# Patient Record
Sex: Male | Born: 2000 | Race: White | Hispanic: No | Marital: Single | Smoking: Never smoker
Health system: Southern US, Community
[De-identification: ages and names within clinical notes are randomized; demographics above are authoritative.]

---
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
## 2019-08-14 ENCOUNTER — Other Ambulatory Visit: Payer: Self-pay

## 2019-08-14 ENCOUNTER — Ambulatory Visit (INDEPENDENT_AMBULATORY_CARE_PROVIDER_SITE_OTHER): Payer: Medicaid Other

## 2019-08-14 ENCOUNTER — Ambulatory Visit (INDEPENDENT_AMBULATORY_CARE_PROVIDER_SITE_OTHER): Payer: Medicaid Other | Admitting: Family Medicine

## 2019-08-14 ENCOUNTER — Encounter: Payer: Self-pay | Admitting: Family Medicine

## 2019-08-14 VITALS — BP 106/60 | HR 79 | Ht 66.0 in | Wt 119.6 lb

## 2019-08-14 DIAGNOSIS — M25562 Pain in left knee: Secondary | ICD-10-CM

## 2019-08-14 DIAGNOSIS — M25462 Effusion, left knee: Secondary | ICD-10-CM

## 2019-08-14 NOTE — Patient Instructions (Signed)
Thank you for coming in today. Plan for MRI of knee.  Use the hinged knee brace.  Use crutches as needed.  Ok to walk without crutches if able.  Use tylenol or ibuprofen for pain and swelling.  Apply ice for 20 mins 2x daily for a few days.  Return following MRI.  Let me know if you do not hear about MRI scheduling soon.  Phone number to radiology at [facility] is [phone] You can get crutches from [facility] supply here in [place].    Anterior Cruciate Ligament Tear  Ligaments are strong bands of tissue that connect bones to each other. The anterior cruciate ligament (ACL) connects your shin bone to your thigh bone. A tear in this ligament can cause pain and make it hard for you to use your leg to support your body weight. There are three types of ACL injuries:  Grade 1. In this type, the ligament is stretched.  Grade 2. In this type, the ligament is partially torn.  Grade 3. In this type, the ligament is completely torn. What are the causes? This condition happens when too much pressure is put on the ACL. This condition may be caused by:  Twisting your knee, especially with your foot planted.  Making a quick change in direction (cut and pivot).  Slowing down quickly while running.  Landing a jump without bending your knee.  Forcefully straightening (hyperextending)your knee more than normal.  Getting hit in the knee.  Hitting your knee on the ground. What increases the risk? You are more likely to develop this condition if:  You are a woman.  You play sports that involve jumping or changing directions often. These include: ? Football. ? Basketball. ? Soccer. ? Volleyball. ? Skiing. ? Hockey. ? Gymnastics. What are the signs or symptoms? Symptoms of this condition include:  Feeling or hearing a popping at the time of injury.  A feeling that your knee is giving way at the time of injury.  Pain.  Swelling.  Tenderness.  Instability  when you try to put weight on your injured leg.  Inability to move your knee as far as normal.  Difficulty walking. How is this diagnosed? This condition may be diagnosed based on:  Your symptoms.  Your medical history.  A physical exam. During your physical exam, your provider will test your knee to see if it moves more than it should (has laxity).  Imaging tests, such as: ? An X-ray. This may be done to check for bone injuries. ? MRI. This may be done to see if the tear is partial or complete and to check for additional injuries. How is this treated? This condition may be treated by:  Resting your knee.  Avoiding activities that cause pain, instability, or swelling.  Raising (elevating) your knee while resting.  Keeping weight off your leg until pain and swelling improve. You may need to use crutches or a walker.  Icing your knee.  Taking medicine to reduce pain and swelling.  Wearing a knee brace.  Wearing a compression bandage or wrap to reduce swelling.  Doing range-of-motion, strengthening, and stretching exercises (physical therapy).  Having surgery. This may be needed if you are very active and have a complete tear. Follow these instructions at home: If you have a brace:  Wear the brace as told by your health care provider. Remove it only as told by your health care provider.  Loosen the brace if your toes tingle, become numb, or turn  cold and blue.  Keep the brace clean.  If the brace is not waterproof: ? Do not let it get wet. ? Cover it with a watertight covering when you take a bath or shower. Managing pain, stiffness, and swelling   If directed, put ice on your knee: ? If you have a removable brace, remove it as told by your health care provider. ? Put ice in a plastic bag. ? Place a towel between your skin and the bag. ? Leave the ice on for 20 minutes, 2-3 times a day.  Move your foot often to reduce stiffness and swelling.  Raise (elevate)  your knee above the level of your heart while you are sitting or lying down. Medicines  Take over-the-counter and prescription medicines only as told by your health care provider.  Ask your health care provider if the medicine prescribed to you: ? Requires you to avoid driving or using heavy machinery. ? Can cause constipation. You may need to take actions to prevent or treat constipation, such as:  Drink enough fluid to keep your urine pale yellow.  Take over-the-counter or prescription medicines.  Eat foods that are high in fiber, such as beans, whole grains, and fresh fruits and vegetables.  Limit foods that are high in fat and processed sugars, such as fried or sweet foods. Activity  Ask your health care provider when it is safe to drive if you have a brace on your leg.  Return to your normal activities as told by your health care provider. Ask your health care provider what activities are safe for you.  Do exercises as told by your health care provider. General instructions  Do not use the injured limb to support your body weight until your health care provider says that you can. Use crutches or a walker as told by your health care provider.  Do not use any products that contain nicotine or tobacco, such as cigarettes, e-cigarettes, and chewing tobacco. These can delay healing. If you need help quitting, ask your health care provider.  Keep all follow-up visits as told by your health care provider. This is important. How is this prevented?  Warm up and stretch before being active.  Cool down and stretch after being active.  Give your body time to rest between periods of activity.  Make sure to use equipment that fits you.  If you wear cleats, make sure they are appropriate for your playing surface.  Be safe and responsible while being active. This will help you avoid falls.  Maintain physical fitness, including: ? Strength. ? Flexibility. ? Cardiovascular  fitness. ? Endurance. Contact a health care provider if:  Your symptoms are not improving.  Your symptoms are getting worse. Summary  A tear in the anterior cruciate ligament (ACL) can cause pain and make it hard for you to use your leg to support your body weight.  Treatment for this condition may include resting, icing, wearing compression wraps or a knee brace, taking medicines, or having surgery.  Do not use the injured limb to support your body weight until your health care provider says that you can. Use crutches or a walker as told by your health care provider.  Contact a health care provider if your symptoms do not improve or get worse.  Keep all follow-up visits as told by your health care provider. This is important. This information is not intended to replace advice given to you by your health care provider. Make sure you discuss any  questions you have with your health care provider. Document Revised: 02/14/2018 Document Reviewed: 02/14/2018 Elsevier Patient Education  2020 [employer].

## 2019-08-14 NOTE — Progress Notes (Signed)
Subjective:    CC: L Knee pain  I, Molly Weber, LAT, ATC, am serving as scribe for Dr. Lynne Leader.  HPI: Pt is a 19 y/o male presenting w/ c/o L lateral knee pain x one day when he got hit in his knee by a teammate and then took a quick step for a ball that caused pain in his L post-lateral knee.  He describes a sensation of instability after cutting attempt.  Pt rates his pain at a 6/10 and describes his pain as aching.  He notes the knee developed swelling and effusion rapidly following the injury.  He was unable to continue playing.  Radiating pain: no Knee swelling: yes Mechanical knee symptoms: No Aggravating factors: Walking; knee flexion Treatments tried: IBU, IcyHot,   Pertinent review of Systems: No fevers or chills.  No radiating pain weakness or numbness distally.  Relevant historical information: No history of prior knee injury.   Objective:    Vitals:   08/14/19 1358  BP: 106/60  Pulse: 79  SpO2: 95%   General: Well Developed, well nourished, and in no acute distress.   MSK:  Left knee: Large effusion otherwise normal-appearing. Range of motion 5-100 degrees. Nontender to palpation. No laxity to valgus varus stress test without pain. Slight laxity to anterior drawer testing however exam limited by guarding. Mildly positive medial and lateral McMurray's test however again exam is limited by guarding. Intact flexion extension strength.  Right knee: Normal-appearing Nontender. Range of motion 0-120 degrees. No laxity to valgus varus stress test or anterior posterior drawer test. Negative Murray's test. Intact strength.  Antalgic gait.  Lab and Radiology Results  X-ray images left knee obtained today personally and independently reviewed Moderate effusion.  No visible fractures.  No severe degenerative changes. Await formal radiology review   Impression and Recommendations:    Assessment and Plan: 19 y.o. male with acute left knee injury.   Patient describes a noncontact injury rapidly followed by significant knee effusion and discomfort.  Patient does have some laxity and mechanical symptoms on today's exam.  Concern for ACL tear.  Plan for MRI and recheck following MRI.  Proceed with hinged knee brace and crutches..   Orders Placed This Encounter  Procedures  . DG Knee 4 Views W/Patella Left    Standing Status:   Future    Number of Occurrences:   1    Standing Expiration Date:   10/11/2020    Order Specific Question:   Reason for Exam (SYMPTOM  OR DIAGNOSIS REQUIRED)    Answer:   eval knee pain and swelling. Non-contact. Concern ACL    Order Specific Question:   Preferred imaging location?    Answer:   [facility]    Order Specific Question:   Radiology Contrast Protocol - do NOT remove file path    Answer:   \\[email or web address]  . MR Knee Left  Wo Contrast    Standing Status:   Future    Standing Expiration Date:   10/11/2020    Order Specific Question:   ** REASON FOR EXAM (FREE TEXT)    Answer:   eval poss ACL tear knee.    Order Specific Question:   What is the patient's sedation requirement?    Answer:   No Sedation    Order Specific Question:   Does the patient have a pacemaker or implanted devices?    Answer:   No    Order Specific Question:   Preferred imaging location?  Answer:   [facility] (table limit-350lbs)    Order Specific Question:   Radiology Contrast Protocol - do NOT remove file path    Answer:   \\[email or web address]   No orders of the defined types were placed in this encounter.   Discussed warning signs or symptoms. Please see discharge instructions. Patient expresses understanding.   The above documentation has been reviewed and is accurate and complete Andre Schmitt

## 2019-08-15 NOTE — Progress Notes (Signed)
No fracture visible. MRI will be helpful.

## 2019-08-21 ENCOUNTER — Encounter: Payer: Self-pay | Admitting: Family Medicine

## 2019-08-21 ENCOUNTER — Encounter: Payer: Self-pay | Admitting: Physical Therapy

## 2019-08-21 NOTE — Addendum Note (Signed)
Addended by: Edwena Felty T on: 08/21/2019 03:53 PM   Modules accepted: Orders

## 2019-08-21 NOTE — Telephone Encounter (Signed)
Patient will come back tomorrow between 1030 and 1100

## 2019-08-30 ENCOUNTER — Ambulatory Visit (HOSPITAL_BASED_OUTPATIENT_CLINIC_OR_DEPARTMENT_OTHER)
Admission: RE | Admit: 2019-08-30 | Discharge: 2019-08-30 | Disposition: A | Discharge status: Home / Self Care | Payer: Medicaid Other | Source: Ambulatory Visit | Attending: Family Medicine | Admitting: Family Medicine

## 2019-08-30 ENCOUNTER — Other Ambulatory Visit: Payer: Self-pay

## 2019-08-30 DIAGNOSIS — M25562 Pain in left knee: Secondary | ICD-10-CM | POA: Diagnosis present

## 2019-08-30 DIAGNOSIS — M25462 Effusion, left knee: Secondary | ICD-10-CM | POA: Diagnosis present

## 2019-08-31 ENCOUNTER — Encounter: Payer: Self-pay | Admitting: Family Medicine

## 2019-08-31 DIAGNOSIS — M25462 Effusion, left knee: Secondary | ICD-10-CM

## 2019-08-31 DIAGNOSIS — M25562 Pain in left knee: Secondary | ICD-10-CM

## 2019-09-01 ENCOUNTER — Encounter: Payer: Self-pay | Admitting: Physical Therapy

## 2019-09-01 NOTE — Progress Notes (Signed)
MRI knee shows a large bone contusion (bruise to bone) but fortunately the ACL did not tear.  This is great news.  Please return to clinic to discuss MRI findings in further detail.

## 2019-09-02 ENCOUNTER — Other Ambulatory Visit: Payer: Self-pay

## 2019-09-02 ENCOUNTER — Encounter: Payer: Self-pay | Admitting: Family Medicine

## 2019-09-02 ENCOUNTER — Ambulatory Visit (INDEPENDENT_AMBULATORY_CARE_PROVIDER_SITE_OTHER): Payer: Medicaid Other | Admitting: Family Medicine

## 2019-09-02 VITALS — BP 122/70 | HR 113 | Ht 66.0 in | Wt 118.4 lb

## 2019-09-02 DIAGNOSIS — M25462 Effusion, left knee: Secondary | ICD-10-CM

## 2019-09-02 NOTE — Patient Instructions (Signed)
Thank you for coming in today. Plan for PT.   [phone] Ok to use compression.  Recheck in 1 month.

## 2019-09-02 NOTE — Progress Notes (Signed)
I, Andre Schmitt, LAT, ATC, am serving as scribe for Dr. Clementeen Graham.  Andre Schmitt is a 19 y.o. male who presents to [facility] at [facility] [address] today for f/u of L knee pain and swelling after injuring his L knee while playing soccer on 08/13/19.  He was last seen by Dr. Denyse Amass on 08/14/19 and was placed in a hinged knee brace.  He had a L knee MRI on 08/30/19 and is here today to review his L knee MRI results as well as discuss next steps regarding treatment.  He has been provided w/ a HEP focusing on knee ROM and quad activation/strengthening.  Since his last visit, pt reports that his L knee is feeling better but still has a good amount of swelling.  He rates his knee pain at a 3/10.  He states that he has been walking a good amount.  He is no longer using his crutches and still using the compression wrap and his knee brace.  He has been doing his HEP daily.  Diagnostic imaging: L knee XR- 08/14/19; L knee MRI- 08/30/19   Pertinent review of systems: No fevers or chills  Relevant historical information: No significant past medical history.   Exam:  BP 122/70 (BP Location: Left Arm, Patient Position: Sitting, Cuff Size: Normal)   Pulse (!) 113   Ht 5\' 6"  (1.676 m)   Wt 118 lb 6.4 oz (53.7 kg)   SpO2 97%   BMI 19.11 kg/m  General: Well Developed, well nourished, and in no acute distress.   MSK: Left knee: Mild effusion otherwise normal-appearing nontender normal motion.    Lab and Radiology Results No results found for this or any previous visit (from the past 72 hour(s)). MR Knee Left  Wo Contrast  Result Date: 08/31/2019 CLINICAL DATA:  Left knee pain for 2 weeks since a twisting injury playing soccer. Subsequent encounter. EXAM: MRI OF THE LEFT KNEE WITHOUT CONTRAST TECHNIQUE: Multiplanar, multisequence MR imaging of the knee was performed. No intravenous contrast was administered. COMPARISON:  Plain films left knee 08/14/2019. FINDINGS: MENISCI Medial meniscus:  Intact.  Lateral meniscus:  Intact. LIGAMENTS Cruciates:  Intact. Collaterals:  Intact. CARTILAGE Patellofemoral:  Normal. Medial:  Normal. Lateral:  Normal. Joint:  Moderate to large joint effusion. Popliteal Fossa:  No Baker's cyst. Extensor Mechanism:  Intact. Bones: There is marrow edema in the medial femoral condyle consistent with a bone contusion. No fracture. Other: None. IMPRESSION: Bone contusion without fracture in the medial femoral condyle. Associated moderate to large joint effusion is noted. Negative for meniscal or ligament tear. Electronically Signed   By: 10/12/2019 M.D.   On: 08/31/2019 10:01    I, 09/02/2019, personally (independently) visualized and performed the interpretation of the images attached in this note.   Assessment and Plan: 19 y.o. male with knee pain.  Patient had noncontact injury concerning for ACL tear.  Fortunately MRI did not show ACL tear but did show bone contusion.  He likely had the same mechanism that typically would result in ACL tear but fortunately did not suffer a tear.  Plan for trial of physical therapy to improve quad strengthening.  Advance activity as tolerated and recheck in 1 month.  Total encounter time 20 minutes including charting time date of service.     Discussed warning signs or symptoms. Please see discharge instructions. Patient expresses understanding.   The above documentation has been reviewed and is accurate and complete 12

## 2019-09-05 ENCOUNTER — Encounter: Payer: Self-pay | Admitting: Family Medicine

## 2019-09-16 ENCOUNTER — Ambulatory Visit: Payer: Medicaid Other | Attending: Family Medicine

## 2019-09-16 ENCOUNTER — Other Ambulatory Visit: Payer: Self-pay

## 2019-09-16 DIAGNOSIS — R262 Difficulty in walking, not elsewhere classified: Secondary | ICD-10-CM

## 2019-09-16 DIAGNOSIS — M25562 Pain in left knee: Secondary | ICD-10-CM

## 2019-09-16 DIAGNOSIS — M25462 Effusion, left knee: Secondary | ICD-10-CM | POA: Diagnosis present

## 2019-09-16 DIAGNOSIS — M6281 Muscle weakness (generalized): Secondary | ICD-10-CM

## 2019-09-17 NOTE — Therapy (Signed)
[facility] [address] [place], [place], [address] Phone: [phone]   Fax:  [phone]  Physical Therapy Evaluation  Patient Details  Name: Andre Schmitt MRN: [id number] Date of Birth: 24-Jan-2001 Referring Provider (PT): Rodolph Bong, MD   Encounter Date: 09/16/2019  PT End of Session - 09/17/19 2206    Visit Number  1    Number of Visits  13    Date for PT Re-Evaluation  10/01/19    Authorization Type  MCD    Authorization Time Period  09/16/19-11/14/19    Authorization - Visit Number  0    Authorization - Number of Visits  3    Progress Note Due on Visit  0.01    PT Start Time  1540    PT Stop Time  1625    PT Time Calculation (min)  45 min    Activity Tolerance  Patient tolerated treatment well    Behavior During Therapy  [facility] for tasks assessed/performed       History reviewed. No pertinent past medical history.  History reviewed. No pertinent surgical history.  There were no vitals filed for this visit.   Subjective Assessment - 09/17/19 2151    Subjective  Pt reports although his L knee is improving with less pain and swelling, his is still having difficulty with standing and walking.    Limitations  Standing;Walking    How long can you sit comfortably?  not an issue    How long can you stand comfortably?  30 mins    How long can you walk comfortably?  1 hour    Diagnostic tests  MRI 08/31/19 IMPRESSION: Bone contusion without fracture in the medial femoral condyle. Associated moderate to large joint effusion is noted. Negative for meniscal or ligament tear.    Patient Stated Goals  To start working and return back to physical activity    Currently in Pain?  Yes    Pain Score  3     Pain Location  Knee    Pain Orientation  Left    Pain Descriptors / Indicators  Aching    Pain Type  Acute pain    Pain Radiating Towards  NA    Pain Onset  1 to 4 weeks ago    Pain Frequency  Intermittent    Aggravating Factors    Extended standing and walking activities    Pain Relieving Factors  Rest, cold packs    Effect of Pain on Daily Activities  Limited tolerance         [facility] PT Assessment - 09/17/19 0001      Assessment   Medical Diagnosis  Acute L knee pain    Referring Provider (PT)  Rodolph Bong, MD    Onset Date/Surgical Date  08/12/19    Hand Dominance  Right    Next MD Visit  3 weeks      Precautions   Precautions  None      Restrictions   Weight Bearing Restrictions  No      Balance Screen   Has the patient fallen in the past 6 months  No    Has the patient had a decrease in activity level because of a fear of falling?   Yes    Is the patient reluctant to leave their home because of a fear of falling?   No      Home Public house manager residence  Living Arrangements  Parent    Type of Home  House    Home Access  Stairs to enter      Prior Function   Level of Independence  Independent      Cognition   Overall Cognitive Status  Within Functional Limits for tasks assessed      Observation/Other Assessments-Edema    Edema  Circumferential      Circumferential Edema   Circumferential - Left   L 32.4 cm; R 31.6      Sensation   Light Touch  Appears Intact      Coordination   Gross Motor Movements are Fluid and Coordinated  Yes      Functional Tests   Functional tests  --      Posture/Postural Control   Posture/Postural Control  No significant limitations      ROM / Strength   AROM / PROM / Strength  AROM;Strength      AROM   AROM Assessment Site  Knee    Right/Left Knee  Right;Left    Right Knee Extension  0    Right Knee Flexion  140    Left Knee Extension  0    Left Knee Flexion  95      Strength   Strength Assessment Site  Knee    Right/Left Knee  Right;Left    Right Knee Flexion  5/5    Right Knee Extension  5/5    Left Knee Flexion  4+/5    Left Knee Extension  4+/5      Palpation   Patella mobility  WNLs      Special Tests     Special Tests  Meniscus Tests;Knee Special Tests    Knee Special tests   other    Meniscus Tests  McMurray Test   Neg     other    Findings  Negative    Comments  Ant drawer; Collateral ligament tests      McMurray Test   Findings  Negative      Ambulation/Gait   Ambulation/Gait  Yes    Ambulation Distance (Feet)  510 Feet   2 min walking test   Gait Pattern  Antalgic    Gait velocity  4.25 ft/sec      Functional Gait  Assessment   Gait assessed   Yes                Objective measurements completed on examination: See above findings.              PT Education - 09/17/19 2204    Education Details  Pt was educated in gradually increasing his activity level as tolerated and using a cold pack as often as every 2 hours to manage L knee swelling; HEP    Person(s) Educated  Patient    Methods  Explanation;Demonstration;Tactile cues;Verbal cues;Handout    Comprehension  Verbalized understanding;Returned demonstration;Verbal cues required;Tactile cues required;Need further instruction       PT Short Term Goals - 09/17/19 2301      PT SHORT TERM GOAL #1   Title  Pt will be Ind in an initial PT HEP to address L LE ROM, strength, and functional mobility    Baseline  HEP provided by MD    Time  3    Period  Weeks    Status  New    Target Date  10/08/19        PT Long Term Goals - 09/17/19 2309  PT LONG TERM GOAL #1   Title  Pt will be ind in a final HEP to continue the rehab process at home following DC from [place].    Baseline  Initial HEP    Time  6    Period  Weeks    Status  New    Target Date  10/29/19      PT LONG TERM GOAL #2   Title  Pt's L knee swelling will decrease to 31.8 cm to improve pt's L knee flexion ROM and functional mobility.    Baseline  32.4 cm    Time  6    Period  Weeks    Status  New    Target Date  10/29/19      PT LONG TERM GOAL #3   Title  Improved pt's 2 min walking test to 659ft for improved  quality of gait.    Baseline  550ft    Time  6    Period  Weeks    Status  New    Target Date  10/29/19      PT LONG TERM GOAL #4   Title  Pt will be able return to work and recreational sports with appropriate function of his L knee.    Baseline  Currently unable    Time  6    Period  Weeks    Status  New    Target Date  10/29/19             Plan - 09/17/19 2244    Clinical Impression Statement  Pt's presents with decreased functional mobility following a non-contact injury of his L knee when playing soccer. Pt's condition is improving re: pain and swelling, however he continues to be limited in basic ambulation and higher functioning/skilled mobility activities. Swelling of the L knee is present and the L knee strength is min decreased.    Examination-Activity Limitations  Stand;Stairs;Squat;Lift;Locomotion Level;Transfers    Examination-Participation Restrictions  [employer]    Rehab Potential  Good    PT Frequency  2x / week    PT Duration  6 weeks    PT Treatment/Interventions  Cryotherapy;Ultrasound;Moist Heat;Iontophoresis 4mg /ml Dexamethasone;Electrical Stimulation;Gait training;Stair training;Functional mobility training;Neuromuscular re-education;Balance training;Therapeutic exercise;Therapeutic activities;Patient/family education;Manual techniques;Dry needling;Passive range of motion;Vasopneumatic Device;Joint Manipulations    PT Next Visit Plan  Assess response to HEP. Progress strengthening and ROM exercise and functional activities of the L knee as indicated.    PT Home Exercise Plan  [email or web address]    Consulted and Agree with Plan of Care  Patient       Patient will benefit from skilled therapeutic intervention in order to improve the following deficits and impairments:  Decreased activity tolerance, Decreased range of motion, Decreased strength, Decreased mobility  Visit Diagnosis: Acute pain of left knee - Plan: PT plan of care cert/re-cert  Effusion of left  knee - Plan: PT plan of care cert/re-cert  Difficulty in walking, not elsewhere classified - Plan: PT plan of care cert/re-cert  Muscle weakness (generalized) - Plan: PT plan of care cert/re-cert     Problem List There are no problems to display for this patient.  Gar Ponto MS, PT 09/17/19 11:35 PM   [facility] [address] [place], [place], [address] Phone: [phone]   Fax:  [phone]  Name: Andre Schmitt MRN: [id number] Date of Birth: [birth year]

## 2019-09-22 ENCOUNTER — Other Ambulatory Visit: Payer: Self-pay

## 2019-09-22 ENCOUNTER — Ambulatory Visit: Payer: Medicaid Other | Admitting: Physical Therapy

## 2019-09-22 ENCOUNTER — Encounter: Payer: Self-pay | Admitting: Physical Therapy

## 2019-09-22 DIAGNOSIS — R262 Difficulty in walking, not elsewhere classified: Secondary | ICD-10-CM

## 2019-09-22 DIAGNOSIS — M25562 Pain in left knee: Secondary | ICD-10-CM | POA: Diagnosis not present

## 2019-09-22 DIAGNOSIS — M25462 Effusion, left knee: Secondary | ICD-10-CM

## 2019-09-22 DIAGNOSIS — M6281 Muscle weakness (generalized): Secondary | ICD-10-CM

## 2019-09-22 NOTE — Therapy (Signed)
[place], [place], [address] Phone: [phone]   Fax:  [phone]  Physical Therapy Treatment  Patient Details  Name: Andre Schmitt MRN: [id number] Date of Birth: 04-20-2001 Referring Provider (PT): Gregor Hams, MD   Encounter Date: 09/22/2019  PT End of Session - 09/22/19 1046    Visit Number  2    Number of Visits  13    Date for PT Re-Evaluation  10/01/19    Authorization Type  MCD    PT Start Time  1045    PT Stop Time  1130    PT Time Calculation (min)  45 min    Activity Tolerance  Patient tolerated treatment well    Behavior During Therapy  [facility] for tasks assessed/performed       History reviewed. No pertinent past medical history.  History reviewed. No pertinent surgical history.  There were no vitals filed for this visit.  Subjective Assessment - 09/22/19 1045    Subjective  Patient reports he is doing well. He hasn't been having much pain and his exercises are going well.    Patient Stated Goals  To start working and return back to physical activity    Currently in Pain?  Yes    Pain Score  0-No pain    Pain Location  Knee    Pain Orientation  Left    Pain Descriptors / Indicators  --    Pain Type  Acute pain    Pain Onset  1 to 4 weeks ago    Pain Frequency  Intermittent         [facility] PT Assessment - 09/22/19 0001      AROM   Right Knee Extension  0    Right Knee Flexion  140    Left Knee Extension  0    Left Knee Flexion  105   115 following stretching                  [facility] Adult PT Treatment/Exercise - 09/22/19 0001      Exercises   Exercises  Knee/Hip      Knee/Hip Exercises: Stretches   Passive Hamstring Stretch  3 reps;30 seconds    Passive Hamstring Stretch Limitations  supine with strap    Quad Stretch  3 reps;20 seconds    Quad Stretch Limitations  prone with strap    Knee: Self-Stretch to increase Flexion  5 reps;10 seconds    Knee:  Self-Stretch Limitations  supine heel slide with strap      Knee/Hip Exercises: Aerobic   Nustep  L5 x 4 min with LE only      Knee/Hip Exercises: Standing   Heel Raises  20 reps    Other Standing Knee Exercises  Lateral band walk with green around knees 2x10      Knee/Hip Exercises: Seated   Long Arc Quad  2 sets;15 reps    Long Arc Quad Limitations  red band    Hamstring Curl  2 sets;15 reps    Hamstring Limitations  red band    Sit to [employer]  2 sets;10 reps      Knee/Hip Exercises: Supine   Straight Leg Raises  2 sets;10 reps      Manual Therapy   Manual Therapy  Soft tissue mobilization    Soft tissue mobilization  Left quad with roller             PT Education - 09/22/19  1045    Education Details  HEP    Person(s) Educated  Patient    Methods  Explanation;Demonstration;Tactile cues;Verbal cues;Handout    Comprehension  Verbalized understanding;Returned demonstration;Verbal cues required;Tactile cues required;Need further instruction       PT Short Term Goals - 09/17/19 2301      PT SHORT TERM GOAL #1   Title  Pt will be Ind in an initial PT HEP to address L LE ROM, strength, and functional mobility    Baseline  HEP provided by MD    Time  3    Period  Weeks    Status  New    Target Date  10/08/19        PT Long Term Goals - 09/17/19 2309      PT LONG TERM GOAL #1   Title  Pt will be ind in a final HEP to continue the rehab process at home following DC from [facility] health OPPT.    Baseline  Initial HEP    Time  6    Period  Weeks    Status  New    Target Date  10/29/19      PT LONG TERM GOAL #2   Title  Pt's L knee swelling will decrease to 31.8 cm to improve pt's L knee flexion ROM and functional mobility.    Baseline  32.4 cm    Time  6    Period  Weeks    Status  New    Target Date  10/29/19      PT LONG TERM GOAL #3   Title  Improved pt's 2 min walking test to 693ft for improved quality of gait.    Baseline  587ft    Time  6    Period   Weeks    Status  New    Target Date  10/29/19      PT LONG TERM GOAL #4   Title  Pt will be able return to work and recreational sports with appropriate function of his L knee.    Baseline  Currently unable    Time  6    Period  Weeks    Status  New    Target Date  10/29/19            Plan - 09/22/19 1049    Clinical Impression Statement  Patient exhibits improved knee flexion range of motion this visit and is progressing well with his strengthening exercises. He reported feeling tightness in mid-distal quad with knee flexion and he was able to improve ~10 deg following stretching from initial measurement. He was provided with progressions in knee flexion stretching and LE strengthening for HEP. He did not report any increased knee pain with strengthening, but he did exhibit fatigue with quad strengthening. He would benefit from continued skilled PT to progress knee motion and strength so he can return to playing soccer with no limitation.    PT Treatment/Interventions  Cryotherapy;Ultrasound;Moist Heat;Iontophoresis 4mg /ml Dexamethasone;Electrical Stimulation;Gait training;Stair training;Functional mobility training;Neuromuscular re-education;Balance training;Therapeutic exercise;Therapeutic activities;Patient/family education;Manual techniques;Dry needling;Passive range of motion;Vasopneumatic Device;Joint Manipulations    PT Next Visit Plan  Assess response to HEP. Progress strengthening and ROM exercise and functional activities of the L knee as indicated.    PT Home Exercise Plan  [email or web address]: prone quad stretch with strap, supine heel slide with strap, supine hamstring stretch with strap, SLR, sit-to-stand, heel raises, seated resisted LAQ and hamstring curl with red, lateral band walk with green around knees  Consulted and Agree with Plan of Care  Patient       Patient will benefit from skilled therapeutic intervention in order to improve the following deficits and impairments:   Decreased activity tolerance, Decreased range of motion, Decreased strength, Decreased mobility  Visit Diagnosis: Acute pain of left knee  Effusion of left knee  Difficulty in walking, not elsewhere classified  Muscle weakness (generalized)     Problem List There are no problems to display for this patient.   Rosana Hoes, PT, DPT, LAT, ATC 09/22/19  11:58 AM Phone: [phone] Fax: [phone]   [facility] [address] [place], [place], [address] Phone: [phone]   Fax:  [phone]  Name: Andre Schmitt MRN: [id number] Date of Birth: 2000/07/21

## 2019-09-22 NOTE — Patient Instructions (Signed)
Access Code: [email or web address] URL: [email or web address] Date: 09/22/2019 Prepared by: Rosana Hoes  Exercises Prone Quadriceps Stretch with Strap - 2 x daily - 7 x weekly - 3 reps - 20 seconds hold Supine Heel Slide with Strap - 2 x daily - 7 x weekly - 5 reps - 10 seconds hold Supine Hamstring Stretch with Strap - 2 x daily - 7 x weekly - 3 reps - 30 seconds hold Straight Leg Raise with Arm Support - 1 x daily - 7 x weekly - 10 reps - 3 sets Sit to Stand without Arm Support - 1 x daily - 7 x weekly - 10 reps - 3 sets Heel Raise - 1 x daily - 7 x weekly - 3 sets - 20 reps Seated Knee Extension with Resistance - 1 x daily - 7 x weekly - 3 sets - 12 reps Seated Hamstring Curl with Anchored Resistance - 1 x daily - 7 x weekly - 3 sets - 10 reps Side Stepping with Resistance at Thighs - 1 x daily - 7 x weekly - 3 sets - 10 reps

## 2019-09-25 ENCOUNTER — Ambulatory Visit: Payer: Medicaid Other | Admitting: Physical Therapy

## 2019-09-25 ENCOUNTER — Other Ambulatory Visit: Payer: Self-pay

## 2019-09-29 ENCOUNTER — Ambulatory Visit: Payer: Medicaid Other

## 2019-10-01 ENCOUNTER — Ambulatory Visit: Payer: Medicaid Other

## 2019-10-01 ENCOUNTER — Other Ambulatory Visit: Payer: Self-pay

## 2019-10-01 DIAGNOSIS — M25562 Pain in left knee: Secondary | ICD-10-CM

## 2019-10-01 DIAGNOSIS — M25462 Effusion, left knee: Secondary | ICD-10-CM

## 2019-10-01 DIAGNOSIS — R262 Difficulty in walking, not elsewhere classified: Secondary | ICD-10-CM

## 2019-10-01 DIAGNOSIS — M6281 Muscle weakness (generalized): Secondary | ICD-10-CM

## 2019-10-01 NOTE — Therapy (Signed)
[place], [place], [address] Phone: [phone]   Fax:  [phone]  Physical Therapy Treatment  Patient Details  Name: Andre Schmitt MRN: [id number] Date of Birth: April 10, 2001 Referring Provider (PT): Gregor Hams, MD   Encounter Date: 10/01/2019  PT End of Session - 10/01/19 1405    Visit Number  3    Number of Visits  13    Date for PT Re-Evaluation  10/06/19    Authorization Type  MCD    Authorization - Visit Number  2    Authorization - Number of Visits  3    Progress Note Due on Visit  0    PT Start Time  3474    PT Stop Time  1142    PT Time Calculation (min)  50 min    Activity Tolerance  Patient tolerated treatment well    Behavior During Therapy  [facility] for tasks assessed/performed       History reviewed. No pertinent past medical history.  History reviewed. No pertinent surgical history.  There were no vitals filed for this visit.  Subjective Assessment - 10/01/19 1100    Subjective  Pt reports his L knee is doing well with only infrequent, brief incidences of Inf patella/patellar tendon pain.         [facility] PT Assessment - 10/01/19 0001      Circumferential Edema   Circumferential - Left   L 32.1 cm      AROM   Left Knee Flexion  135                   [facility] Adult PT Treatment/Exercise - 10/01/19 0001      Exercises   Exercises  Knee/Hip      Knee/Hip Exercises: Aerobic   Nustep  L5 x 4 min with LE only      Knee/Hip Exercises: Machines for Strengthening   Cybex Knee Extension  5 lbs, 10x2    Cybex Knee Flexion  15lbs, 10x2    Cybex Leg Press  20lbs, 10x2      Knee/Hip Exercises: Standing   Heel Raises  20 reps;2 sets    Other Standing Knee Exercises  Lateral band walk with green around knees 3x30      Manual Therapy   Manual Therapy  Other (comment)    Manual therapy comments  XFM    Soft tissue mobilization  Left sup. patellar tendon              PT Education - 10/01/19 1405    Education Details  Sup. Patellar tendon XFM massage to help reduce frequency of painful incidences.    Person(s) Educated  Patient    Methods  Explanation;Demonstration;Tactile cues;Verbal cues    Comprehension  Verbalized understanding;Returned demonstration;Verbal cues required;Tactile cues required;Need further instruction       PT Short Term Goals - 09/17/19 2301      PT SHORT TERM GOAL #1   Title  Pt will be Ind in an initial PT HEP to address L LE ROM, strength, and functional mobility    Baseline  HEP provided by MD    Time  3    Period  Weeks    Status  New    Target Date  10/08/19        PT Long Term Goals - 09/17/19 2309      PT LONG TERM GOAL #1   Title  Pt will be ind in  a final HEP to continue the rehab process at home following DC from [facility] health OPPT.    Baseline  Initial HEP    Time  6    Period  Weeks    Status  New    Target Date  10/29/19      PT LONG TERM GOAL #2   Title  Pt's L knee swelling will decrease to 31.8 cm to improve pt's L knee flexion ROM and functional mobility.    Baseline  32.4 cm    Time  6    Period  Weeks    Status  New    Target Date  10/29/19      PT LONG TERM GOAL #3   Title  Improved pt's 2 min walking test to 630ft for improved quality of gait.    Baseline  589ft    Time  6    Period  Weeks    Status  New    Target Date  10/29/19      PT LONG TERM GOAL #4   Title  Pt will be able return to work and recreational sports with appropriate function of his L knee.    Baseline  Currently unable    Time  6    Period  Weeks    Status  New    Target Date  10/29/19            Plan - 10/01/19 1558    Clinical Impression Statement  Pt's functional use of the L LE continues to improve. A limp was not observed. L knee flexion has improved to 135d and a min. decrease in swelling was noted. During strengthening exs c the L quad, decreased strength and fatigue was observed. Pt  reports he has been completing his HEP.    PT Treatment/Interventions  Cryotherapy;Ultrasound;Moist Heat;Iontophoresis 4mg /ml Dexamethasone;Electrical Stimulation;Gait training;Stair training;Functional mobility training;Neuromuscular re-education;Balance training;Therapeutic exercise;Therapeutic activities;Patient/family education;Manual techniques;Dry needling;Passive range of motion;Vasopneumatic Device;Joint Manipulations    PT Next Visit Plan  Pt reports because of starting a new job,he may need to finish with OPPT next week. Will determine course of care as per pt's schedule. For PT beyond the next visit a re-certification will need to be completed. Will insure pt has an appropriate home ex program, if his next PT session is his final treatment.       Patient will benefit from skilled therapeutic intervention in order to improve the following deficits and impairments:  Decreased activity tolerance, Decreased range of motion, Decreased strength, Decreased mobility  Visit Diagnosis: Acute pain of left knee  Effusion of left knee  Difficulty in walking, not elsewhere classified  Muscle weakness (generalized)     Problem List There are no problems to display for this patient.   MS, PT 10/01/19 4:16 PM  [facility] [address] [place], [place], [place] Phone: [phone]   Fax:  [phone]  Name: Andre Schmitt MRN: [id number] Monday Date of Birth: 17-Oct-2000

## 2019-10-06 ENCOUNTER — Other Ambulatory Visit: Payer: Self-pay

## 2019-10-06 ENCOUNTER — Ambulatory Visit: Payer: Medicaid Other

## 2019-10-06 DIAGNOSIS — M25562 Pain in left knee: Secondary | ICD-10-CM | POA: Diagnosis not present

## 2019-10-06 DIAGNOSIS — M25462 Effusion, left knee: Secondary | ICD-10-CM

## 2019-10-07 NOTE — Therapy (Signed)
[place], [place], [address] Phone: [phone]   Fax:  [phone]  Physical Therapy Treatment/Discharge  Patient Details  Name: Andre Schmitt MRN: [id number] Date of Birth: January 17, 2001 Referring Provider (PT): Gregor Hams, MD   Encounter Date: 10/06/2019  PT End of Session - 10/07/19 0838    Visit Number  4    Number of Visits  13    Date for PT Re-Evaluation  10/06/19    Authorization Type  MCD    Authorization Time Period  09/16/19-11/14/19    Authorization - Visit Number  3    Authorization - Number of Visits  3    PT Start Time  6962    PT Stop Time  1534    PT Time Calculation (min)  47 min    Activity Tolerance  Patient tolerated treatment well    Behavior During Therapy  [facility] for tasks assessed/performed       History reviewed. No pertinent past medical history.  History reviewed. No pertinent surgical history.  There were no vitals filed for this visit.  Subjective Assessment - 10/07/19 0748    Subjective  Pt reports because of his progress and starting a new job this week, he is wanting to DC PT. Pt states he has improved 90% with an infrequent sensation of L knee stiffness or inf. patellar pain. pt reports he has started riding a bike. ptstateshe is going to wait another 2 to 4 weeks before playing soccer with friends.    How long can you sit comfortably?  not an issue    How long can you stand comfortably?  3 hours    How long can you walk comfortably?  3 hours    Patient Stated Goals  Pt is to start a new job this week.    Currently in Pain?  Yes   Infrequent, breif pain   Pain Score  0-No pain    Pain Location  Knee    Pain Orientation  Left    Pain Descriptors / Indicators  Sharp    Pain Type  Other (Comment)   sub-acute   Pain Onset  More than a month ago    Pain Frequency  Occasional    Aggravating Factors   Certain movements of the L knee    Pain Relieving Factors  Rest    Effect of Pain on Daily Activities  Limits high level athletic activiites ie recreational soccer    Multiple Pain Sites  No         [facility] PT Assessment - 10/07/19 0001      Circumferential Edema   Circumferential - Left   31.6 cm      AROM   Left Knee Flexion  143                   [facility] Adult PT Treatment/Exercise - 10/07/19 0001      Exercises   Exercises  Knee/Hip      Knee/Hip Exercises: Machines for Strengthening   Cybex Knee Extension  15 lbs, 10x    Cybex Knee Flexion  25, 10x      Knee/Hip Exercises: Plyometrics   Unilateral Jumping Limitations  Side to side; diagonal front/back 15x each LE    Other Plyometric Exercises  Sprinting; side steps; braiding at 75% for 30 yds x 4 each       Knee/Hip Exercises: Standing   Other Standing Knee Exercises  Tband (blue) diagonal  squated steps forward and backward 36fx2 each      Knee/Hip Exercises: Seated   Long Arc Quad  2 sets;15 reps    Long Arc Quad Limitations  Blue band    Hamstring Curl  2 sets;15 reps    Hamstring Limitations  Blue band             PT Education - 10/07/19 0805    Education Details  Final HEP. Pt is feel 100% comfortable with replicated high level athletic activities prior to returning to recreational soccer.    Person(s) Educated  Patient    Methods  Explanation;Demonstration;Tactile cues;Verbal cues;Handout    Comprehension  Verbalized understanding;Returned demonstration;Verbal cues required;Tactile cues required       PT Short Term Goals - 10/07/19 0823      PT SHORT TERM GOAL #1   Title  Pt is Ind with a final HEP for ROM, strength, and functional mobility    Baseline  Initial HEP provided by MD    Target Date  10/06/19        PT Long Term Goals - 10/07/19 0824      PT LONG TERM GOAL #1   Title  Pt is Ind c a final HEP including high level replicated athletic activities    Baseline  Initial HEP    Target Date  10/06/19      PT LONG TERM GOAL #2   Title   Circumfertial l knee measurement = 31.6 cm.    Baseline  32.4 cm    Target Date  10/06/19      PT LONG TERM GOAL #3   Title  2 min walking test = 816 ft    Baseline  5170f   Target Date  10/06/19      PT LONG TERM GOAL #4   Title  Pt is returning to work this week and plans to recreational soccer, 2-4 weeks, when he is completely comfortable with high level athletic activities provided today.    Baseline  Unable    Target Date  10/06/19            Plan - 10/07/19 [id number]  Clinical Impression Statement  Pt has made good progress meeting all goals and tolerating high level athletic replicated tasks. L knee ROM is equal to R; there is slight remaining, but improved swelling; and MMT of the L knee is 5/5 for flex and ext. LAQ on the weight machine found LAQ to tolerated 5 lbs les than the R c multiple reps.    Personal Factors and Comorbidities  Comorbidity 3+    PT Next Visit Plan  PT is Dced from PT c goals met    PT Home Exercise Plan  Added to HEP: Progressing from 75% to 100%: sprinting, side steps, braiding, SL lateral hops, SL forward/back diagonal hops. Tband diagonal steps forward and backwards.       Patient will benefit from skilled therapeutic intervention in order to improve the following deficits and impairments:     Visit Diagnosis: Effusion of left knee     Problem List There are no problems to display for this patient.  Ahsan Esterline MS, PT 10/07/19 2:46 PM  PHYSICAL THERAPY DISCHARGE SUMMARY  Visits from Start of Care: 4  Current functional level related to goals / functional outcomes Goals met   Remaining deficits: See above   Education / Equipment: NA Plan: Patient agrees to discharge.  Patient goals were met. Patient is being discharged  due to meeting the stated rehab goals.  ?????      [place], [place], [address] Phone: [phone]   Fax:  [phone]  Name:  Andre Schmitt MRN: [id number] Date of Birth: Jan 08, 2001

## 2019-10-07 NOTE — Patient Instructions (Addendum)
Added to HEP: Progressing from 75% to 100%: sprinting, side steps, braiding, SL lateral hops, SL forward/back diagonal hops. Tband diagonal steps forward and backwards.

## 2019-10-08 ENCOUNTER — Ambulatory Visit: Payer: Medicaid Other

## 2020-06-17 ENCOUNTER — Ambulatory Visit: Payer: Self-pay

## 2020-06-17 ENCOUNTER — Other Ambulatory Visit: Payer: Self-pay | Admitting: Family Medicine

## 2020-06-17 ENCOUNTER — Other Ambulatory Visit: Payer: Self-pay

## 2020-06-17 DIAGNOSIS — M25571 Pain in right ankle and joints of right foot: Secondary | ICD-10-CM
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
## 2022-05-03 ENCOUNTER — Ambulatory Visit (HOSPITAL_COMMUNITY)
Admission: RE | Admit: 2022-05-03 | Discharge: 2022-05-03 | Disposition: A | Discharge status: Home / Self Care | Payer: Medicaid Other | Source: Ambulatory Visit | Attending: Family Medicine | Admitting: Family Medicine

## 2022-05-03 ENCOUNTER — Encounter (HOSPITAL_COMMUNITY): Payer: Self-pay

## 2022-05-03 VITALS — BP 128/67 | HR 98 | Temp 97.8°F | Resp 12

## 2022-05-03 DIAGNOSIS — R198 Other specified symptoms and signs involving the digestive system and abdomen: Secondary | ICD-10-CM | POA: Diagnosis not present

## 2022-05-03 NOTE — ED Triage Notes (Signed)
Pt is here for stomach discomfort x2days

## 2022-05-04 NOTE — ED Provider Notes (Signed)
  [facility]   [id number] 05/03/22 Arrival Time: 4174  ASSESSMENT & PLAN:  1. Abdominal wall symptom     Benign abdominal exam. No indications for urgent abdominal/pelvic imaging at this time. Discussed. No hernia appreciated.    Follow-up Information     [facility].   Specialty: Urgent Care Why: As needed. Contact information: [place] [phone]                Reviewed expectations re: course of current medical issues. Questions answered. Outlined signs and symptoms indicating need for more acute intervention. Patient verbalized understanding. After Visit Summary given.   SUBJECTIVE: History from: patient. Andre Schmitt is a 21 y.o. male who presents wondering if he has an umbilical hernia. Noted a slight bulge superior to umbilicus a few days ago. "Think I pushed it back in." No pain or recurrence of symptoms. Normal PO intake without n/v/d. No abd injury. Feels well.  History reviewed. No pertinent surgical history.   OBJECTIVE:  Vitals:   05/03/22 1611  BP: 128/67  Pulse: 98  Resp: 12  Temp: 97.8 F (36.6 C)  TempSrc: Oral  SpO2: 98%    General appearance: alert, oriented, no acute distress HEENT: [place]; AT; oropharynx moist Lungs: unlabored respirations Abdomen: soft; normal; no umbilical hernia noted Back: without reported CVA tenderness; FROM at waist Extremities: without LE edema; symmetrical; without gross deformities Skin: warm and dry Neurologic: normal gait Psychological: alert and cooperative; normal mood and affect  No Known Allergies                                             History reviewed. No pertinent past medical history.  Social History   Socioeconomic History   Marital status: Single    Spouse name: Not on file   Number of children: Not on file   Years of education: Not on file   Highest education level: Not on file  Occupational  History   Not on file  Tobacco Use   Smoking status: Never   Smokeless tobacco: Never  Substance and Sexual Activity   Alcohol use: No   Drug use: No   Sexual activity: Never  Other Topics Concern   Not on file  Social History Narrative   Not on file   Social Determinants of Health   Financial Resource Strain: Not on file  Food Insecurity: Not on file  Transportation Needs: Not on file  Physical Activity: Not on file  Stress: Not on file  Social Connections: Not on file  Intimate Partner Violence: Not on file    History reviewed. No pertinent family history.   Vanessa Kick, MD 05/04/22 [phone]

## 2023-05-01 ENCOUNTER — Ambulatory Visit (HOSPITAL_COMMUNITY)
Admission: RE | Admit: 2023-05-01 | Discharge: 2023-05-01 | Disposition: A | Discharge status: Home / Self Care | Payer: Medicaid Other | Source: Ambulatory Visit | Attending: Internal Medicine | Admitting: Internal Medicine

## 2023-05-01 ENCOUNTER — Encounter (HOSPITAL_COMMUNITY): Payer: Self-pay

## 2023-05-01 ENCOUNTER — Ambulatory Visit (INDEPENDENT_AMBULATORY_CARE_PROVIDER_SITE_OTHER): Payer: Medicaid Other

## 2023-05-01 VITALS — BP 123/83 | HR 89 | Temp 98.2°F | Resp 16

## 2023-05-01 DIAGNOSIS — S93601A Unspecified sprain of right foot, initial encounter: Secondary | ICD-10-CM
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
## 2023-05-01 NOTE — Discharge Instructions (Signed)
Your x-rays of your foot were negative for fracture or dislocation. You likely sprained your foot.   Wear supportive shoes for the next few days to provide compression, stability, and comfort.  Please rest, ice, and elevate your foot to help it heal and decrease inflammation.   Take 600mg  ibuprofen and/or 1,000mg  tylenol every 6 hours as needed for pain and inflammation. Take with food to avoid stomach upset.  Call the orthopedic provider listed on your discharge paperwork to schedule a follow-up appointment if your symptoms do not improve in the next 1-2 weeks with supportive care.  Return to urgent care if you experience worsening pain, numbness, tingling, change of color in your skin near the injury, or any other concerning symptoms.  I hope you feel better!
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
## 2023-10-11 ENCOUNTER — Ambulatory Visit: Payer: Commercial Managed Care - PPO | Admitting: Internal Medicine

## 2023-10-11 ENCOUNTER — Telehealth: Payer: Self-pay

## 2023-10-11 NOTE — Telephone Encounter (Signed)
 Pt same day cancelled a NP appt with Morrie Sheldon on 10/11/23. I reran his two insurances and one was still active, I called pt to try to get in same appt time today, someone had already grabbed the spot. I did not reschedule. Let me know if I can reschedule him.

## 2023-10-11 NOTE — Telephone Encounter (Signed)
 Copied from CRM [id number]. Topic: Appointments - Appointment Cancel/Reschedule >> Oct 11, 2023 12:49 PM Gurney Maxin H wrote: Patient called to cancel appointment for today with Morrie Sheldon at 3:00 pm, not sure if insurance is valid no longer with employer.

## 2023-10-12 NOTE — Telephone Encounter (Signed)
 Spoke with pt and scheduled NP APPT.

## 2023-10-12 NOTE — Telephone Encounter (Signed)
 Ok to allow 1x reschedule.

## 2023-10-16 ENCOUNTER — Ambulatory Visit: Admitting: Family Medicine
